# Patient Record
Sex: Female | Born: 1990 | Race: Black or African American | Hispanic: No | Marital: Single | Smoking: Current some day smoker
Health system: Southern US, Community
[De-identification: ages and names within clinical notes are randomized; demographics above are authoritative.]

## PROBLEM LIST (undated history)

## (undated) DIAGNOSIS — R51 Headache: Secondary | ICD-10-CM

## (undated) DIAGNOSIS — R519 Headache, unspecified: Secondary | ICD-10-CM

## (undated) HISTORY — PX: ROTATOR CUFF REPAIR: SHX139

## (undated) HISTORY — PX: BREAST SURGERY: SHX581

## (undated) HISTORY — PX: BUNIONECTOMY: SHX129

---

## 2011-08-25 ENCOUNTER — Emergency Department (HOSPITAL_BASED_OUTPATIENT_CLINIC_OR_DEPARTMENT_OTHER)
Admission: EM | Admit: 2011-08-25 | Discharge: 2011-08-25 | Disposition: A | Discharge status: Home / Self Care | Payer: Medicaid Other | Attending: Emergency Medicine | Admitting: Emergency Medicine

## 2011-08-25 ENCOUNTER — Encounter: Payer: Self-pay | Admitting: *Deleted

## 2011-08-25 ENCOUNTER — Emergency Department (INDEPENDENT_AMBULATORY_CARE_PROVIDER_SITE_OTHER): Payer: Medicaid Other

## 2011-08-25 DIAGNOSIS — H9209 Otalgia, unspecified ear: Secondary | ICD-10-CM | POA: Insufficient documentation

## 2011-08-25 DIAGNOSIS — J029 Acute pharyngitis, unspecified: Secondary | ICD-10-CM

## 2011-08-25 DIAGNOSIS — B349 Viral infection, unspecified: Secondary | ICD-10-CM

## 2011-08-25 DIAGNOSIS — B9789 Other viral agents as the cause of diseases classified elsewhere: Secondary | ICD-10-CM | POA: Insufficient documentation

## 2011-08-25 DIAGNOSIS — R05 Cough: Secondary | ICD-10-CM

## 2011-08-25 MED ORDER — GUAIFENESIN-CODEINE 100-10 MG/5ML PO SYRP: 5.0000 mL | ORAL_SOLUTION | Freq: Three times a day (TID) | ORAL | Status: AC | PRN

## 2011-08-25 MED ORDER — IBUPROFEN 800 MG PO TABS: 800.0000 mg | ORAL_TABLET | Freq: Three times a day (TID) | ORAL | Status: AC

## 2011-08-25 NOTE — ED Provider Notes (Signed)
History     CSN: [id number] Arrival date & time: 08/25/2011  6:14 PM   First MD Initiated Contact with Patient 08/25/11 1846      Chief Complaint  Patient presents with  . Sore Throat    (Consider location/radiation/quality/duration/timing/severity/associated sxs/prior treatment) Patient is a 20 y.o. female presenting with cough. The history is provided by the patient. No language interpreter was used.  Cough This is a new problem. The current episode started 2 days ago. The problem occurs constantly. The problem has been gradually worsening. The cough is non-productive. There has been no fever. Associated symptoms include ear pain, headaches, rhinorrhea and sore throat. She has tried nothing for the symptoms. She is a smoker. Her past medical history does not include bronchitis, pneumonia or asthma.    History reviewed. No pertinent past medical history.  History reviewed. No pertinent past surgical history.  History reviewed. No pertinent family history.  History  Substance Use Topics  . Smoking status: Current Everyday Smoker  . Smokeless tobacco: Not on file  . Alcohol Use: No    OB History    Grav Para Term Preterm Abortions TAB SAB Ect Mult Living                  Review of Systems  HENT: Positive for ear pain, sore throat and rhinorrhea.   Respiratory: Positive for cough.   Neurological: Positive for headaches.  All other systems reviewed and are negative.    Allergies  Review of patient's allergies indicates no known allergies.  Home Medications   Current Outpatient Rx  Name Route Sig Dispense Refill  . ETONOGESTREL 68 MG [place] IMPL Subcutaneous Inject 1 each into the skin once.        BP 111/61  Pulse 76  Temp(Src) 98.6 F (37 C) (Oral)  Resp 18  Ht 5\' 5"  (1.651 m)  Wt 170 lb (77.111 kg)  BMI 28.29 kg/m2  SpO2 100%  LMP 08/18/2011  Physical Exam  Nursing note and vitals reviewed. Constitutional: She is oriented to person, place, and time. She  appears well-developed and well-nourished.  HENT:  Head: Normocephalic and atraumatic.  Right Ear: External ear normal.  Left Ear: External ear normal.  Nose: Nose normal.  Mouth/Throat: Oropharynx is clear and moist.  Eyes: Conjunctivae and EOM are normal. Pupils are equal, round, and reactive to light.  Neck: Normal range of motion. Neck supple.  Cardiovascular: Normal rate and normal heart sounds.   Pulmonary/Chest: Effort normal.  Abdominal: Soft.  Musculoskeletal: Normal range of motion.  Neurological: She is alert and oriented to person, place, and time.  Skin: Skin is warm.  Psychiatric: She has a normal mood and affect.    ED Course  Procedures (including critical care time)  Labs Reviewed - No data to display Dg Chest 2 View  08/25/2011  *RADIOLOGY REPORT*  Clinical Data: Cough and sore throat.  CHEST - 2 VIEW  Comparison: None.  Findings: The lungs are clear without focal consolidation, edema, effusion or pneumothorax.  Cardiopericardial silhouette is within normal limits for size.  Imaged bony structures of the thorax are intact.  IMPRESSION: Normal chest x-ray.  Original Report Authenticated By: ERIC A. MANSELL, M.D.     No diagnosis found.    MDM  No pneumonia  I will treat with ibuprofen and cheratuss        Langston Masker, [place] 08/25/11 2055

## 2011-08-25 NOTE — ED Notes (Signed)
Pt states she has had sore throat, chills, body aches and fever since Friday.

## 2011-08-25 NOTE — ED Provider Notes (Signed)
Evaluation and management procedures were performed by the PA/NP under my supervision/collaboration.    Demitria Hay D Skylur Fuston, MD 08/25/11 2335 

## 2013-08-09 ENCOUNTER — Encounter (HOSPITAL_BASED_OUTPATIENT_CLINIC_OR_DEPARTMENT_OTHER): Payer: Self-pay | Admitting: Emergency Medicine

## 2013-08-09 ENCOUNTER — Emergency Department (HOSPITAL_BASED_OUTPATIENT_CLINIC_OR_DEPARTMENT_OTHER): Payer: Medicaid Other

## 2013-08-09 ENCOUNTER — Emergency Department (HOSPITAL_BASED_OUTPATIENT_CLINIC_OR_DEPARTMENT_OTHER)
Admission: EM | Admit: 2013-08-09 | Discharge: 2013-08-09 | Disposition: A | Discharge status: Home / Self Care | Payer: Medicaid Other | Attending: Emergency Medicine | Admitting: Emergency Medicine

## 2013-08-09 DIAGNOSIS — R109 Unspecified abdominal pain: Secondary | ICD-10-CM | POA: Insufficient documentation

## 2013-08-09 DIAGNOSIS — K6289 Other specified diseases of anus and rectum: Secondary | ICD-10-CM

## 2013-08-09 DIAGNOSIS — Z3202 Encounter for pregnancy test, result negative: Secondary | ICD-10-CM | POA: Insufficient documentation

## 2013-08-09 DIAGNOSIS — M545 Low back pain, unspecified: Secondary | ICD-10-CM | POA: Insufficient documentation

## 2013-08-09 DIAGNOSIS — N83209 Unspecified ovarian cyst, unspecified side: Secondary | ICD-10-CM | POA: Insufficient documentation

## 2013-08-09 DIAGNOSIS — N83202 Unspecified ovarian cyst, left side: Secondary | ICD-10-CM

## 2013-08-09 DIAGNOSIS — K644 Residual hemorrhoidal skin tags: Secondary | ICD-10-CM | POA: Insufficient documentation

## 2013-08-09 DIAGNOSIS — F17200 Nicotine dependence, unspecified, uncomplicated: Secondary | ICD-10-CM | POA: Insufficient documentation

## 2013-08-09 LAB — URINE MICROSCOPIC-ADD ON

## 2013-08-09 LAB — COMPREHENSIVE METABOLIC PANEL
ALT: 16 U/L (ref 0–35)
AST: 19 U/L (ref 0–37)
Albumin: 3.5 g/dL (ref 3.5–5.2)
Alkaline Phosphatase: 56 U/L (ref 39–117)
Calcium: 9 mg/dL (ref 8.4–10.5)
GFR calc Af Amer: >90 mL/min (ref >90)
Glucose, Bld: 95 mg/dL (ref 70–99)
Potassium: 3.7 mEq/L (ref 3.5–5.1)
Sodium: 138 mEq/L (ref 135–145)
Total Protein: 6.9 g/dL (ref 6.0–8.3)

## 2013-08-09 LAB — CBC WITH DIFFERENTIAL/PLATELET
Basophils Absolute: 0 10*3/uL (ref 0.0–0.1)
Eosinophils Absolute: 0.3 10*3/uL (ref 0.0–0.7)
Eosinophils Relative: 2 % (ref 0–5)
Lymphs Abs: 3.7 10*3/uL (ref 0.7–4.0)
MCH: 31.3 pg (ref 26.0–34.0)
MCV: 93.6 fL (ref 78.0–100.0)
Neutrophils Relative %: 63 % (ref 43–77)
Platelets: 337 10*3/uL (ref 150–400)
RBC: 3.77 MIL/uL — ABNORMAL LOW (ref 3.87–5.11)
RDW: 12.1 % (ref 11.5–15.5)
WBC: 13.6 10*3/uL — ABNORMAL HIGH (ref 4.0–10.5)

## 2013-08-09 LAB — URINALYSIS, ROUTINE W REFLEX MICROSCOPIC
Bilirubin Urine: NEGATIVE
Glucose, UA: NEGATIVE mg/dL
Hgb urine dipstick: NEGATIVE
Ketones, ur: NEGATIVE mg/dL
Nitrite: NEGATIVE
Specific Gravity, Urine: 1.024 (ref 1.005–1.030)
pH: 8 (ref 5.0–8.0)

## 2013-08-09 LAB — PREGNANCY, URINE: Preg Test, Ur: NEGATIVE

## 2013-08-09 MED ORDER — IOHEXOL 300 MG/ML  SOLN
100.0000 mL | Freq: Once | INTRAMUSCULAR | Status: AC | PRN
  Administered 2013-08-09: 100 mL via INTRAVENOUS

## 2013-08-09 MED ORDER — IOHEXOL 300 MG/ML  SOLN
50.0000 mL | Freq: Once | INTRAMUSCULAR | Status: AC | PRN
  Administered 2013-08-09: 50 mL via ORAL

## 2013-08-09 MED ORDER — HYDROCODONE-ACETAMINOPHEN 5-325 MG PO TABS
1.0000 | ORAL_TABLET | Freq: Once | ORAL | Status: AC
  Administered 2013-08-09: 1 via ORAL
  Filled 2013-08-09: qty 1

## 2013-08-09 MED ORDER — HYDROCODONE-ACETAMINOPHEN 5-325 MG PO TABS: 1.0000 | ORAL_TABLET | ORAL | Status: DC | PRN

## 2013-08-09 NOTE — ED Provider Notes (Signed)
CSN: [id number]     Arrival date & time 08/09/13  1451 History  This chart was scribed for Candyce Churn, MD by Leone Payor, ED Scribe. This patient was seen in room MH12/MH12 and the patient's care was started 3:08 PM.    Chief Complaint  Patient presents with  . Rectal Pain    The history is provided by the patient. No language interpreter was used.    HPI Comments: Raquell Cimmino is a 22 y.o. female who presents to the Emergency Department complaining of constant, worsened rectal pain that began today. Pt describes this pain as sharp and states it is worsened by sitting down and having a BM. Pt states her last BM was this morning and was very painful. She has associated lower back and lower abdominal pain.  She reports having similar symptoms in the past but this episode is more severe and has lasted much longer. She denies history of hemorrhoids. She denies constipation, hematochezia, vaginal bleeding, vaginal discharge, dysuria, fever.    History reviewed. No pertinent past medical history. Past Surgical History  Procedure Laterality Date  . Rotator cuff repair      R shoulder   No family history on file. History  Substance Use Topics  . Smoking status: Current Every Day Smoker  . Smokeless tobacco: Not on file  . Alcohol Use: No   OB History   Grav Para Term Preterm Abortions TAB SAB Ect Mult Living                 Review of Systems  Constitutional: Negative for fever.  Gastrointestinal: Positive for abdominal pain and rectal pain. Negative for constipation, blood in stool and anal bleeding.  Genitourinary: Negative for dysuria, vaginal bleeding and vaginal discharge.  Musculoskeletal: Positive for back pain.  All other systems reviewed and are negative.    Allergies  Review of patient's allergies indicates no known allergies.  Home Medications   Current Outpatient Rx  Name  Route  Sig  Dispense  Refill  . etonogestrel (IMPLANON) 68 MG IMPL implant  Subcutaneous   Inject 1 each into the skin once.            BP 109/57  Pulse 82  Temp(Src) 98.6 F (37 C) (Oral)  Resp 18  Ht 5\' 5"  (1.651 m)  Wt 175 lb (79.379 kg)  BMI 29.12 kg/m2  SpO2 99%  LMP 07/17/2013 Physical Exam  Nursing note and vitals reviewed. Constitutional: She is oriented to person, place, and time. She appears well-developed and well-nourished. No distress.  HENT:  Head: Normocephalic and atraumatic.  Mouth/Throat: Oropharynx is clear and moist.  Eyes: Conjunctivae are normal. Pupils are equal, round, and reactive to light. No scleral icterus.  Neck: Neck supple.  Cardiovascular: Normal rate, regular rhythm, normal heart sounds and intact distal pulses.   No murmur heard. Pulmonary/Chest: Effort normal and breath sounds normal. No stridor. No respiratory distress. She has no rales.  Abdominal: Soft. Bowel sounds are normal. She exhibits no distension. There is no tenderness.  Genitourinary: Rectal exam shows external hemorrhoid (2 small, soft, non tender external hemorrhoids ) and tenderness ( Tenderness to palpation of the proximal rectum). Rectal exam shows no internal hemorrhoid, no fissure and no mass.  Musculoskeletal: Normal range of motion.  Neurological: She is alert and oriented to person, place, and time.  Skin: Skin is warm and dry. No rash noted.  Psychiatric: She has a normal mood and affect. Her behavior is normal.  ED Course  Procedures   DIAGNOSTIC STUDIES: Oxygen Saturation is 99% on RA, normal by my interpretation.    COORDINATION OF CARE: 3:26 PM Will order lab work and give Vicodin for pain. Discussed treatment plan with pt at bedside and pt agreed to plan.  Medications  HYDROcodone-acetaminophen (NORCO/VICODIN) 5-325 MG per tablet 1 tablet (1 tablet Oral Given 08/09/13 1552)  iohexol (OMNIPAQUE) 300 MG/ML solution 50 mL (50 mLs Oral Contrast Given 08/09/13 1710)  iohexol (OMNIPAQUE) 300 MG/ML solution 100 mL (100 mLs Intravenous  Contrast Given 08/09/13 1937)    Labs Review Labs Reviewed  CBC WITH DIFFERENTIAL - Abnormal; Notable for the following:    WBC 13.6 (*)    RBC 3.77 (*)    Hemoglobin 11.8 (*)    HCT 35.3 (*)    Neutro Abs 8.5 (*)    All other components within normal limits  URINALYSIS, ROUTINE W REFLEX MICROSCOPIC - Abnormal; Notable for the following:    APPearance TURBID (*)    All other components within normal limits  URINE MICROSCOPIC-ADD ON - Abnormal; Notable for the following:    Bacteria, UA FEW (*)    All other components within normal limits  COMPREHENSIVE METABOLIC PANEL  OCCULT BLOOD X 1 CARD TO LAB, STOOL  PREGNANCY, URINE   Imaging Review Ct Abdomen Pelvis W Contrast  08/09/2013   CLINICAL DATA:  Rectal pain.  EXAM: CT ABDOMEN AND PELVIS WITH CONTRAST  TECHNIQUE: Multidetector CT imaging of the abdomen and pelvis was performed using the standard protocol following bolus administration of intravenous contrast.  CONTRAST:  OMNIPAQUE IOHEXOL 300 MG/ML  SOLN  COMPARISON:  None.  FINDINGS: The liver, gallbladder, pancreas, spleen, adrenal glands, and kidneys are normal in appearance. No evidence of hydronephrosis. Uterus is unremarkable. A simple appearing cyst is seen in the left adnexa measuring 2.8 x 5.2 cm. No other pelvic masses or lymphadenopathy identified.  No evidence of inflammatory process or abnormal fluid collections. There is no evidence of bowel wall thickening, dilatation, or hernia. Normal appendix visualized.  IMPRESSION: 5.2 cm benign appearing left adnexal cyst, most likely representing a functional ovarian cyst. Followup by pelvic ultrasound recommended in 6-12 weeks. This recommendation follows ACR consensus guidelines: White Paper of the ACR Incidental Findings Committee II on Adnexal Findings. J Am Coll Radiol [phone].   Electronically Signed   By: Myles Rosenthal M.D.   On: 08/09/2013 19:59    EKG Interpretation   None       MDM   1. Rectal pain   2.  Left ovarian cyst    22 yo female with "rectal" pain and pain with bowel movements.  On exam, two soft nontender external hemorrhoids, but no internal masses. Does have tenderness on DRE.  Well appearing, no distress, nontoxic, but uncomfortable.  No hx of IBD.  No blood in stool.    Due to deeper nature of pain, CT imaging obtained.  Did not show rectal abscess.  She remained well appearing. Pain improved.  It did show ovarian cyst, which was communicated to her, as well as need for followup.  Repeat abdominal exam benign.  Presentation not c/w ovarian torsion or TOA.    I personally performed the services described in this documentation, which was scribed in my presence. The recorded information has been reviewed and is accurate.     Candyce Churn, MD 08/09/13 [phone]

## 2013-08-09 NOTE — ED Notes (Signed)
Pt. Reports rectal pain that  Shoots down per Pt. And now she is having back pain.  No rectal bleeding and last BM was this morning.

## 2013-10-20 ENCOUNTER — Encounter (HOSPITAL_BASED_OUTPATIENT_CLINIC_OR_DEPARTMENT_OTHER): Payer: Self-pay | Admitting: Emergency Medicine

## 2013-10-20 ENCOUNTER — Emergency Department (HOSPITAL_BASED_OUTPATIENT_CLINIC_OR_DEPARTMENT_OTHER)
Admission: EM | Admit: 2013-10-20 | Discharge: 2013-10-20 | Disposition: A | Discharge status: Home / Self Care | Payer: Medicaid Other | Attending: Emergency Medicine | Admitting: Emergency Medicine

## 2013-10-20 DIAGNOSIS — F17200 Nicotine dependence, unspecified, uncomplicated: Secondary | ICD-10-CM | POA: Insufficient documentation

## 2013-10-20 DIAGNOSIS — J069 Acute upper respiratory infection, unspecified: Secondary | ICD-10-CM | POA: Insufficient documentation

## 2013-10-20 NOTE — Discharge Instructions (Signed)

## 2013-10-20 NOTE — ED Notes (Signed)
C/o "body pain, sore throat, head cold" started yesterday

## 2013-10-20 NOTE — ED Provider Notes (Signed)
CSN: [id number]     Arrival date & time 10/20/13  1651 History   First MD Initiated Contact with Patient 10/20/13 1715     Chief Complaint  Patient presents with  . URI   (Consider location/radiation/quality/duration/timing/severity/associated sxs/prior Treatment) Patient is a 23 y.o. female presenting with URI.  URI  Pt with no signficant PMH reports <24hrs of URI symptoms of sore throat, runny nose, dry cough, myalgias, general malaise and subjective fever. Improved with OTC remedies. Needs work note.   History reviewed. No pertinent past medical history. Past Surgical History  Procedure Laterality Date  . Rotator cuff repair      R shoulder   No family history on file. History  Substance Use Topics  . Smoking status: Current Every Day Smoker  . Smokeless tobacco: Not on file  . Alcohol Use: No   OB History   Grav Para Term Preterm Abortions TAB SAB Ect Mult Living                 Review of Systems All other systems reviewed and are negative except as noted in HPI.   Allergies  Review of patient's allergies indicates no known allergies.  Home Medications   Current Outpatient Rx  Name  Route  Sig  Dispense  Refill  . etonogestrel (IMPLANON) 68 MG IMPL implant   Subcutaneous   Inject 1 each into the skin once.           Marland Kitchen. HYDROcodone-acetaminophen (NORCO/VICODIN) 5-325 MG per tablet   Oral   Take 1 tablet by mouth every 4 (four) hours as needed.   12 tablet   0    BP 105/56  Pulse 72  Temp(Src) 99 F (37.2 C) (Oral)  Resp 20  Ht 5\' 5"  (1.651 m)  Wt 170 lb (77.111 kg)  BMI 28.29 kg/m2  SpO2 99%  LMP 10/18/2013 Physical Exam  Nursing note and vitals reviewed. Constitutional: She is oriented to person, place, and time. She appears well-developed and well-nourished.  HENT:  Head: Normocephalic and atraumatic.  Eyes: EOM are normal. Pupils are equal, round, and reactive to light.  Neck: Normal range of motion. Neck supple.  Cardiovascular: Normal rate,  normal heart sounds and intact distal pulses.   Pulmonary/Chest: Effort normal and breath sounds normal.  Abdominal: Bowel sounds are normal. She exhibits no distension. There is no tenderness.  Musculoskeletal: Normal range of motion. She exhibits no edema and no tenderness.  Neurological: She is alert and oriented to person, place, and time. She has normal strength. No cranial nerve deficit or sensory deficit.  Skin: Skin is warm and dry. No rash noted.  Psychiatric: She has a normal mood and affect.    ED Course  Procedures (including critical care time) Labs Review Labs Reviewed - No data to display Imaging Review No results found.  EKG Interpretation   None       MDM   1. URI, acute        Charles B. Bernette MayersSheldon, MD 10/20/13 1736

## 2013-11-03 ENCOUNTER — Emergency Department (HOSPITAL_BASED_OUTPATIENT_CLINIC_OR_DEPARTMENT_OTHER)
Admission: EM | Admit: 2013-11-03 | Discharge: 2013-11-03 | Disposition: A | Discharge status: Home / Self Care | Payer: Medicaid Other | Attending: Emergency Medicine | Admitting: Emergency Medicine

## 2013-11-03 ENCOUNTER — Emergency Department (HOSPITAL_BASED_OUTPATIENT_CLINIC_OR_DEPARTMENT_OTHER): Payer: Medicaid Other

## 2013-11-03 ENCOUNTER — Encounter (HOSPITAL_BASED_OUTPATIENT_CLINIC_OR_DEPARTMENT_OTHER): Payer: Self-pay | Admitting: Emergency Medicine

## 2013-11-03 DIAGNOSIS — R110 Nausea: Secondary | ICD-10-CM | POA: Insufficient documentation

## 2013-11-03 DIAGNOSIS — O039 Complete or unspecified spontaneous abortion without complication: Secondary | ICD-10-CM | POA: Insufficient documentation

## 2013-11-03 DIAGNOSIS — O9989 Other specified diseases and conditions complicating pregnancy, childbirth and the puerperium: Secondary | ICD-10-CM | POA: Insufficient documentation

## 2013-11-03 DIAGNOSIS — O99330 Smoking (tobacco) complicating pregnancy, unspecified trimester: Secondary | ICD-10-CM | POA: Insufficient documentation

## 2013-11-03 DIAGNOSIS — R42 Dizziness and giddiness: Secondary | ICD-10-CM | POA: Insufficient documentation

## 2013-11-03 LAB — CBC WITH DIFFERENTIAL/PLATELET
Basophils Absolute: 0 10*3/uL (ref 0.0–0.1)
Basophils Relative: 0 % (ref 0–1)
EOS PCT: 1 % (ref 0–5)
Eosinophils Absolute: 0.1 10*3/uL (ref 0.0–0.7)
HEMATOCRIT: 35.6 % — AB (ref 36.0–46.0)
Hemoglobin: 12.1 g/dL (ref 12.0–15.0)
LYMPHS ABS: 2.6 10*3/uL (ref 0.7–4.0)
LYMPHS PCT: 22 % (ref 12–46)
MCH: 31.7 pg (ref 26.0–34.0)
MCHC: 34 g/dL (ref 30.0–36.0)
MCV: 93.2 fL (ref 78.0–100.0)
Monocytes Absolute: 0.9 10*3/uL (ref 0.1–1.0)
Monocytes Relative: 7 % (ref 3–12)
Neutro Abs: 8.4 10*3/uL — ABNORMAL HIGH (ref 1.7–7.7)
Neutrophils Relative %: 70 % (ref 43–77)
PLATELETS: 336 10*3/uL (ref 150–400)
RBC: 3.82 MIL/uL — AB (ref 3.87–5.11)
RDW: 11.7 % (ref 11.5–15.5)
WBC: 11.9 10*3/uL — AB (ref 4.0–10.5)

## 2013-11-03 LAB — HCG, QUANTITATIVE, PREGNANCY: HCG, BETA CHAIN, QUANT, S: 337 m[IU]/mL — AB (ref <5)

## 2013-11-03 MED ORDER — MORPHINE SULFATE 4 MG/ML IJ SOLN
4.0000 mg | Freq: Once | INTRAMUSCULAR | Status: AC
  Administered 2013-11-03: 4 mg via INTRAVENOUS
  Filled 2013-11-03: qty 1

## 2013-11-03 MED ORDER — ACETAMINOPHEN 325 MG PO TABS
650.0000 mg | ORAL_TABLET | Freq: Once | ORAL | Status: AC
  Administered 2013-11-03: 650 mg via ORAL
  Filled 2013-11-03: qty 2

## 2013-11-03 NOTE — Discharge Instructions (Signed)
Miscarriage A miscarriage is the sudden loss of an unborn baby (fetus) before the 20th week of pregnancy. Most miscarriages happen in the first 3 months of pregnancy. Sometimes, it happens before a woman even knows she is pregnant. A miscarriage is also called a "spontaneous miscarriage" or "early pregnancy loss." Having a miscarriage can be an emotional experience. Talk with your caregiver about any questions you may have about miscarrying, the grieving process, and your future pregnancy plans.  Your beta HCG was 337 which has decreased and your US shows no intrauterine pregnancy.   CAUSES   Problems with the fetal chromosomes that make it impossible for the baby to develop normally. Problems with the baby's genes or chromosomes are most often the result of errors that occur, by chance, as the embryo divides and grows. The problems are not inherited from the parents.  Infection of the cervix or uterus.   Hormone problems.   Problems with the cervix, such as having an incompetent cervix. This is when the tissue in the cervix is not strong enough to hold the pregnancy.   Problems with the uterus, such as an abnormally shaped uterus, uterine fibroids, or congenital abnormalities.   Certain medical conditions.   Smoking, drinking alcohol, or taking illegal drugs.   Trauma.  Often, the cause of a miscarriage is unknown.  SYMPTOMS   Vaginal bleeding or spotting, with or without cramps or pain.  Pain or cramping in the abdomen or lower back.  Passing fluid, tissue, or blood clots from the vagina. DIAGNOSIS  Your caregiver will perform a physical exam. You may also have an ultrasound to confirm the miscarriage. Blood or urine tests may also be ordered. TREATMENT   Sometimes, treatment is not necessary if you naturally pass all the fetal tissue that was in the uterus. If some of the fetus or placenta remains in the body (incomplete miscarriage), tissue left behind may become infected  and must be removed. Usually, a dilation and curettage (D and C) procedure is performed. During a D and C procedure, the cervix is widened (dilated) and any remaining fetal or placental tissue is gently removed from the uterus.  Antibiotic medicines are prescribed if there is an infection. Other medicines may be given to reduce the size of the uterus (contract) if there is a lot of bleeding.  If you have Rh negative blood and your baby was Rh positive, you will need a Rh immunoglobulin shot. This shot will protect any future baby from having Rh blood problems in future pregnancies. HOME CARE INSTRUCTIONS   Your caregiver may order bed rest or may allow you to continue light activity. Resume activity as directed by your caregiver.  Have someone help with home and family responsibilities during this time.   Keep track of the number of sanitary pads you use each day and how soaked (saturated) they are. Write down this information.   Do not use tampons. Do not douche or have sexual intercourse until approved by your caregiver.   Only take over-the-counter or prescription medicines for pain or discomfort as directed by your caregiver.   Do not take aspirin. Aspirin can cause bleeding.   Keep all follow-up appointments with your caregiver.   If you or your partner have problems with grieving, talk to your caregiver or seek counseling to help cope with the pregnancy loss. Allow enough time to grieve before trying to get pregnant again.  SEEK IMMEDIATE MEDICAL CARE IF:   You have severe cramps or  pain in your back or abdomen.  You have a fever.  You pass large blood clots (walnut-sized or larger) ortissue from your vagina. Save any tissue for your caregiver to inspect.   Your bleeding increases.   You have a thick, bad-smelling vaginal discharge.  You become lightheaded, weak, or you faint.   You have chills.  MAKE SURE YOU:  Understand these instructions.  Will watch  your condition.  Will get help right away if you are not doing well or get worse. Document Released: 02/26/2001 Document Revised: 12/28/2012 Document Reviewed: 10/22/2011 [facility] Patient Information 2014 [employer] [place].

## 2013-11-03 NOTE — ED Notes (Addendum)
Pt is 3 weeks preg, pt seen by [facility] MD several times for quant and vag bleeding today increased bleeding with lower abd cramping and back pain , pt reports " hormones are not rising like they should "

## 2013-11-03 NOTE — ED Notes (Signed)
[facility]

## 2013-11-03 NOTE — ED Notes (Signed)
Patient transported to Ultrasound 

## 2013-11-03 NOTE — ED Notes (Signed)
MD at bedside. 

## 2013-11-03 NOTE — ED Provider Notes (Signed)
CSN: [id number]     Arrival date & time 11/03/13  1343 History   First MD Initiated Contact with Patient 11/03/13 1541     Chief Complaint  Patient presents with  . Vaginal Bleeding     (Consider location/radiation/quality/duration/timing/severity/associated sxs/prior Treatment) HPI  This is a 23 year old G2 P1 female approximately 3-[redacted] weeks pregnant who presents with vaginal bleeding. Patient has been followed by her obstetrician Dr. Arther AbbottHenry Dorn for vaginal bleeding during pregnancy.  She is getting beta hCGs every 2 days. She's had one ultrasound that did not show an intrauterine pregnancy. Patient reports onset of heavy vaginal bleeding today. She reports dark red blood dropping into the toilet. She reports crampy lower abdominal pain that is mostly left-sided.  Patient also reports dizziness. Currently her pain is 5/10. She states it feels "uncomfortable."  I reviewed the patient's laboratory work from her OB office. Patient had a beta hCG on February 11 that was 553. She subsequently had another beta hCG on February 13 which was 666.  History reviewed. No pertinent past medical history. Past Surgical History  Procedure Laterality Date  . Rotator cuff repair      R shoulder   History reviewed. No pertinent family history. History  Substance Use Topics  . Smoking status: Current Every Day Smoker  . Smokeless tobacco: Not on file  . Alcohol Use: No   OB History   Grav Para Term Preterm Abortions TAB SAB Ect Mult Living                 Review of Systems  Constitutional: Negative for fever.  Respiratory: Negative for chest tightness and shortness of breath.   Cardiovascular: Negative for chest pain.  Gastrointestinal: Positive for nausea and abdominal pain. Negative for vomiting and constipation.  Genitourinary: Positive for vaginal bleeding. Negative for dysuria.  Musculoskeletal: Negative for back pain.  Skin: Negative for wound.  Neurological: Positive for dizziness.  Negative for syncope and headaches.  All other systems reviewed and are negative.      Allergies  Review of patient's allergies indicates no known allergies.  Home Medications   Current Outpatient Rx  Name  Route  Sig  Dispense  Refill  . etonogestrel (IMPLANON) 68 MG IMPL implant   Subcutaneous   Inject 1 each into the skin once.           Marland Kitchen. HYDROcodone-acetaminophen (NORCO/VICODIN) 5-325 MG per tablet   Oral   Take 1 tablet by mouth every 4 (four) hours as needed.   12 tablet   0    BP 100/53  Pulse 68  Resp 16  SpO2 100%  LMP 10/18/2013 Physical Exam  Nursing note and vitals reviewed. Constitutional: She is oriented to person, place, and time. She appears well-developed and well-nourished. No distress.  HENT:  Head: Normocephalic and atraumatic.  Mouth/Throat: Oropharynx is clear and moist.  Eyes: Pupils are equal, round, and reactive to light.  Neck: Neck supple.  Cardiovascular: Normal rate, regular rhythm and normal heart sounds.   Pulmonary/Chest: Effort normal and breath sounds normal. No respiratory distress. She has no wheezes.  Abdominal: Soft. Bowel sounds are normal. There is tenderness. There is no rebound and no guarding.  Genitourinary: Vagina normal.  Small amount of dark blood in the vaginal vault, no active bleeding, no clots noted, cervical os is closed, no adnexal masses or tenderness noted  Neurological: She is alert and oriented to person, place, and time.  Skin: Skin is warm and dry.  Psychiatric:  She has a normal mood and affect.    ED Course  Procedures (including critical care time) Labs Review Labs Reviewed  CBC WITH DIFFERENTIAL - Abnormal; Notable for the following:    WBC 11.9 (*)    RBC 3.82 (*)    HCT 35.6 (*)    Neutro Abs 8.4 (*)    All other components within normal limits  HCG, QUANTITATIVE, PREGNANCY - Abnormal; Notable for the following:    hCG, Beta Chain, Quant, S 337 (*)    All other components within normal  limits   Imaging Review US Ob Comp Less 14 Wks  11/03/2013   CLINICAL DATA:  Pregnant patient with vaginal bleeding. Quantitative HCG 337. Last menstrual period 10/18/2013.  EXAM: OBSTETRIC <14 WK [place] AND TRANSVAGINAL OB US  TECHNIQUE: Both transabdominal and transvaginal ultrasound examinations were performed for complete evaluation of the gestation as well as the maternal uterus, adnexal regions, and pelvic cul-de-sac. Transvaginal technique was performed to assess early pregnancy.  COMPARISON:  None.  FINDINGS: Intrauterine gestational sac: Not visualized.  Yolk sac:  Not visualized.  Embryo:  Not visualized.  Maternal uterus/adnexae: Unremarkable.  IMPRESSION: No evidence of ectopic or intrauterine pregnancy is identified. Findings may be due to early IUP or AB in progress as the patient has vaginal bleeding.   Electronically Signed   By: Drusilla Kanner M.D.   On: 11/03/2013 17:42   US Ob Transvaginal  11/03/2013   CLINICAL DATA:  Pregnant patient with vaginal bleeding. Quantitative HCG 337. Last menstrual period 10/18/2013.  EXAM: OBSTETRIC <14 WK [place] AND TRANSVAGINAL OB US  TECHNIQUE: Both transabdominal and transvaginal ultrasound examinations were performed for complete evaluation of the gestation as well as the maternal uterus, adnexal regions, and pelvic cul-de-sac. Transvaginal technique was performed to assess early pregnancy.  COMPARISON:  None.  FINDINGS: Intrauterine gestational sac: Not visualized.  Yolk sac:  Not visualized.  Embryo:  Not visualized.  Maternal uterus/adnexae: Unremarkable.  IMPRESSION: No evidence of ectopic or intrauterine pregnancy is identified. Findings may be due to early IUP or AB in progress as the patient has vaginal bleeding.   Electronically Signed   By: Drusilla Kanner M.D.   On: 11/03/2013 17:42    EKG Interpretation   None       MDM   Final diagnoses:  Miscarriage   Patient presents with vaginal bleeding during early pregnancy. She is  nontoxic-appearing on exam. She is scant blood in the vaginal vault. Repeat beta hCG here is 337 which is down from 666 based on records. Ultrasound shows no evidence of intrauterine pregnancy or ectopic. Results suggestive of miscarriage in process.  I discussed the results with the patient. She is to followup with her OB. She was given bleeding precautions.  After history, exam, and medical workup I feel the patient has been appropriately medically screened and is safe for discharge home. Pertinent diagnoses were discussed with the patient. Patient was given return precautions.     Shon Baton, MD 11/03/13 [phone]

## 2013-11-03 NOTE — ED Notes (Signed)
Family at bedside. 

## 2016-05-08 ENCOUNTER — Encounter (HOSPITAL_BASED_OUTPATIENT_CLINIC_OR_DEPARTMENT_OTHER): Payer: Self-pay | Admitting: Emergency Medicine

## 2016-05-08 ENCOUNTER — Emergency Department (HOSPITAL_BASED_OUTPATIENT_CLINIC_OR_DEPARTMENT_OTHER)
Admission: EM | Admit: 2016-05-08 | Discharge: 2016-05-08 | Disposition: A | Discharge status: Home / Self Care | Payer: Medicaid Other | Attending: Emergency Medicine | Admitting: Emergency Medicine

## 2016-05-08 DIAGNOSIS — Z87891 Personal history of nicotine dependence: Secondary | ICD-10-CM | POA: Diagnosis not present

## 2016-05-08 DIAGNOSIS — Z3A24 24 weeks gestation of pregnancy: Secondary | ICD-10-CM | POA: Insufficient documentation

## 2016-05-08 DIAGNOSIS — R51 Headache: Secondary | ICD-10-CM | POA: Diagnosis not present

## 2016-05-08 DIAGNOSIS — O26892 Other specified pregnancy related conditions, second trimester: Secondary | ICD-10-CM | POA: Insufficient documentation

## 2016-05-08 DIAGNOSIS — R519 Headache, unspecified: Secondary | ICD-10-CM

## 2016-05-08 LAB — URINALYSIS, ROUTINE W REFLEX MICROSCOPIC
BILIRUBIN URINE: NEGATIVE
GLUCOSE, UA: NEGATIVE mg/dL
HGB URINE DIPSTICK: NEGATIVE
KETONES UR: NEGATIVE mg/dL
Leukocytes, UA: NEGATIVE
Nitrite: NEGATIVE
PROTEIN: NEGATIVE mg/dL
Specific Gravity, Urine: 1.015 (ref 1.005–1.030)
pH: 6 (ref 5.0–8.0)

## 2016-05-08 MED ORDER — METOCLOPRAMIDE HCL 5 MG/ML IJ SOLN
10.0000 mg | Freq: Once | INTRAMUSCULAR | Status: AC
  Administered 2016-05-08: 10 mg via INTRAVENOUS
  Filled 2016-05-08: qty 2

## 2016-05-08 MED ORDER — SODIUM CHLORIDE 0.9 % IV BOLUS (SEPSIS)
1000.0000 mL | Freq: Once | INTRAVENOUS | Status: AC
  Administered 2016-05-08: 1000 mL via INTRAVENOUS

## 2016-05-08 MED ORDER — METOCLOPRAMIDE HCL 10 MG PO TABS: 10.0000 mg | ORAL_TABLET | Freq: Four times a day (QID) | ORAL | 0 refills | Status: DC | PRN

## 2016-05-08 MED ORDER — DIPHENHYDRAMINE HCL 50 MG/ML IJ SOLN
25.0000 mg | Freq: Once | INTRAMUSCULAR | Status: AC
  Administered 2016-05-08: 25 mg via INTRAVENOUS
  Filled 2016-05-08: qty 1

## 2016-05-08 NOTE — ED Triage Notes (Signed)
Pt having daily headaches x1 month.  Taking Tylenol but it is not helping.  [redacted] weeks pregnant.

## 2016-05-08 NOTE — ED Notes (Signed)
Will allow fluid bolus to complete before d/c home.

## 2016-05-08 NOTE — ED Notes (Signed)
MD at bedside. 

## 2016-05-08 NOTE — ED Provider Notes (Signed)
[facility]   CSN: [id number] Arrival date & time: 05/08/16  0122     History   Chief Complaint Chief Complaint  Patient presents with  . Headache    HPI Crystal Henry is a 25 y.o. female who was about [redacted] weeks pregnant. She has no history of chronic headaches. She's been having nearly daily headaches for the past several weeks. They last for several hours. They're located frontally and may occur on just one side at times. Pain is worse with coughing, sneezing or leaning forward. She's been taking Tylenol without adequate relief. She has had associated photophobia and blurred vision but no nausea or vomiting except for one episode of nausea yesterday.  HPI  History reviewed. No pertinent past medical history.  There are no active problems to display for this patient.   Past Surgical History:  Procedure Laterality Date  . BREAST SURGERY    . CESAREAN SECTION    . ROTATOR CUFF REPAIR     R shoulder    OB History    Gravida Para Term Preterm AB Living   1             SAB TAB Ectopic Multiple Live Births                   Home Medications    Prior to Admission medications   Medication Sig Start Date End Date Taking? Authorizing Provider  etonogestrel (IMPLANON) 68 MG IMPL implant Inject 1 each into the skin once.      Historical Provider, MD  HYDROcodone-acetaminophen (NORCO/VICODIN) 5-325 MG per tablet Take 1 tablet by mouth every 4 (four) hours as needed. 08/09/13   Blake DivineJohn Wofford, MD    Family History No family history on file.  Social History Social History  Substance Use Topics  . Smoking status: Former Games developermoker  . Smokeless tobacco: Never Used  . Alcohol use No     Allergies   Review of patient's allergies indicates no known allergies.   Review of Systems Review of Systems  All other systems reviewed and are negative.   Physical Exam Updated Vital Signs BP 102/65 (BP Location: Left Arm)   Pulse 80   Temp 98.6 F  (37 C) (Oral)   Resp 18   Ht 5\' 5"  (1.651 m)   Wt 180 lb (81.6 kg)   SpO2 100%   BMI 29.95 kg/m   Physical Exam General: Well-developed, well-nourished female in no acute distress; appearance consistent with age of record HENT: normocephalic; atraumatic Eyes: pupils equal, round and reactive to light; extraocular muscles intact Neck: supple Heart: regular rate and rhythm Lungs: clear to auscultation bilaterally Abdomen: soft; Gravid, consistent with dates; nontender; bowel sounds present Extremities: No deformity; full range of motion; pulses normal Neurologic: Awake, alert and oriented; motor function intact in all extremities and symmetric; no facial droop; normal coordination, speech and gait Skin: Warm and dry Psychiatric: Normal mood and affect    ED Treatments / Results   Nursing notes and vitals signs, including pulse oximetry, reviewed.  Summary of this visit's results, reviewed by myself:  Labs:  Results for orders placed or performed during the hospital encounter of 05/08/16 (from the past 24 hour(s))  Urinalysis, Routine w reflex microscopic (not at [facility])     Status: Abnormal   Collection Time: 05/08/16  2:00 AM  Result Value Ref Range   Color, Urine YELLOW YELLOW   APPearance CLOUDY (A) CLEAR   Specific Gravity, Urine 1.015  1.005 - 1.030   pH 6.0 5.0 - 8.0   Glucose, UA NEGATIVE NEGATIVE mg/dL   Hgb urine dipstick NEGATIVE NEGATIVE   Bilirubin Urine NEGATIVE NEGATIVE   Ketones, ur NEGATIVE NEGATIVE mg/dL   Protein, ur NEGATIVE NEGATIVE mg/dL   Nitrite NEGATIVE NEGATIVE   Leukocytes, UA NEGATIVE NEGATIVE    Procedures (including critical care time)   Final Clinical Impressions(s) / ED Diagnoses  3:59 AM Patient's headache significantly improved after IV fluids and medications.  Final diagnoses:  Headache in pregnancy, second trimester       Paula LibraJohn Beadie Matsunaga, MD 05/08/16 [phone]

## 2016-05-08 NOTE — ED Notes (Signed)
Bolus completed. Po fluids given. D/c home per order

## 2016-08-13 ENCOUNTER — Other Ambulatory Visit (HOSPITAL_COMMUNITY): Payer: Self-pay | Admitting: Specialist

## 2016-08-13 DIAGNOSIS — Z3689 Encounter for other specified antenatal screening: Secondary | ICD-10-CM

## 2016-08-16 ENCOUNTER — Ambulatory Visit (HOSPITAL_COMMUNITY)
Admission: RE | Admit: 2016-08-16 | Discharge: 2016-08-16 | Disposition: A | Discharge status: Home / Self Care | Payer: Medicaid Other | Source: Ambulatory Visit | Attending: Specialist | Admitting: Specialist

## 2016-08-16 ENCOUNTER — Encounter (HOSPITAL_COMMUNITY): Payer: Self-pay

## 2016-08-16 ENCOUNTER — Other Ambulatory Visit (HOSPITAL_COMMUNITY): Payer: Self-pay | Admitting: Specialist

## 2016-08-16 DIAGNOSIS — O34219 Maternal care for unspecified type scar from previous cesarean delivery: Secondary | ICD-10-CM | POA: Diagnosis not present

## 2016-08-16 DIAGNOSIS — O359XX0 Maternal care for (suspected) fetal abnormality and damage, unspecified, not applicable or unspecified: Secondary | ICD-10-CM

## 2016-08-16 DIAGNOSIS — Z3A37 37 weeks gestation of pregnancy: Secondary | ICD-10-CM | POA: Insufficient documentation

## 2016-08-16 DIAGNOSIS — Z3689 Encounter for other specified antenatal screening: Secondary | ICD-10-CM

## 2016-08-16 HISTORY — DX: Headache, unspecified: R51.9

## 2016-08-16 HISTORY — DX: Headache: R51

## 2016-08-20 ENCOUNTER — Other Ambulatory Visit (HOSPITAL_COMMUNITY): Payer: Self-pay

## 2017-06-21 ENCOUNTER — Encounter (HOSPITAL_COMMUNITY): Payer: Self-pay

## 2018-08-30 ENCOUNTER — Emergency Department (HOSPITAL_BASED_OUTPATIENT_CLINIC_OR_DEPARTMENT_OTHER)
Admission: EM | Admit: 2018-08-30 | Discharge: 2018-08-30 | Disposition: A | Discharge status: Home / Self Care | Payer: Self-pay | Attending: Emergency Medicine | Admitting: Emergency Medicine

## 2018-08-30 ENCOUNTER — Encounter (HOSPITAL_BASED_OUTPATIENT_CLINIC_OR_DEPARTMENT_OTHER): Payer: Self-pay | Admitting: Emergency Medicine

## 2018-08-30 ENCOUNTER — Emergency Department (HOSPITAL_BASED_OUTPATIENT_CLINIC_OR_DEPARTMENT_OTHER): Payer: Self-pay

## 2018-08-30 ENCOUNTER — Other Ambulatory Visit: Payer: Self-pay

## 2018-08-30 DIAGNOSIS — Z79899 Other long term (current) drug therapy: Secondary | ICD-10-CM | POA: Insufficient documentation

## 2018-08-30 DIAGNOSIS — Y9389 Activity, other specified: Secondary | ICD-10-CM | POA: Insufficient documentation

## 2018-08-30 DIAGNOSIS — Y92000 Kitchen of unspecified non-institutional (private) residence as  the place of occurrence of the external cause: Secondary | ICD-10-CM | POA: Insufficient documentation

## 2018-08-30 DIAGNOSIS — W010XXA Fall on same level from slipping, tripping and stumbling without subsequent striking against object, initial encounter: Secondary | ICD-10-CM | POA: Insufficient documentation

## 2018-08-30 DIAGNOSIS — W19XXXA Unspecified fall, initial encounter: Secondary | ICD-10-CM

## 2018-08-30 DIAGNOSIS — F17210 Nicotine dependence, cigarettes, uncomplicated: Secondary | ICD-10-CM | POA: Insufficient documentation

## 2018-08-30 DIAGNOSIS — M25561 Pain in right knee: Secondary | ICD-10-CM | POA: Insufficient documentation

## 2018-08-30 DIAGNOSIS — Y999 Unspecified external cause status: Secondary | ICD-10-CM | POA: Insufficient documentation

## 2018-08-30 MED ORDER — NAPROXEN 500 MG PO TABS
500.0000 mg | ORAL_TABLET | Freq: Two times a day (BID) | ORAL | 0 refills | Status: DC
Start: 2018-08-30 — End: 2020-07-30

## 2018-08-30 NOTE — ED Provider Notes (Signed)
[facility] EMERGENCY DEPARTMENT Provider Note   CSN: [id number] Arrival date & time: 08/30/18  1052   History   Chief Complaint Chief Complaint  Patient presents with  . Knee Pain    HPI Crystal Henry is a 27 y.o. female with past medical history significant for rotator cuff repair who presents for evaluation knee pain.  Patient states her daughter spilled juice on her hardwood floors yesterday morning and when she walked into the kitchen patient fell and landed on her right knee.  Patient states she felt immediate pain to her right knee.  States she was able to get off the ground and ambulate, however has significant pain to her right knee with walking.  States her pain is "all over my knee."  Patient states she has been elevating and icing the knee which has decreased the swelling.  Patient states it is painful to flex and extend at the right knee.  Rates her pain an 8/10.  Denies radiation of pain.  Describes her pain as an aching sensation.  Has not taken anything p.o. for the pain.  Pain is constant in nature, however worsens when she ambulates.  Denies fever, chills, numbness, tingling in her extremities, redness, bruising, midline back pain, bilateral hip pain, pelvic pain, nausea, vomiting.  Patient denies dislocation of knee.  History obtained by patient. No interpretor was used.  HPI  Past Medical History:  Diagnosis Date  . Headache     There are no active problems to display for this patient.   Past Surgical History:  Procedure Laterality Date  . BREAST SURGERY    . BUNIONECTOMY     Rt foot  . CESAREAN SECTION    . ROTATOR CUFF REPAIR     R shoulder     OB History    Gravida  4   Para  2   Term  2   Preterm      AB  1   Living  2     SAB  1   TAB      Ectopic      Multiple      Live Births               Home Medications    Prior to Admission medications   Medication Sig Start Date End Date Taking? Authorizing  Provider  Butalbital-APAP-Caffeine (FIORICET PO) Take by mouth.    [provider]  FOLIC ACID PO Take by mouth.    [provider]  metoCLOPramide (REGLAN) 10 MG tablet Take 1 tablet (10 mg total) by mouth every 6 (six) hours as needed (headache). Patient not taking: Reported on 08/16/2016 05/08/16   Molpus, Jonny RuizJohn, MD  naproxen (NAPROSYN) 500 MG tablet Take 1 tablet (500 mg total) by mouth 2 (two) times daily. 08/30/18   Sincere Liuzzi A, PA-C  Prenatal Vit-Fe Fumarate-FA (PRENATAL VITAMIN PO) Take by mouth.    [provider]  Promethazine HCl (PHENERGAN PO) Take by mouth.    [provider]    Family History No family history on file.  Social History Social History   Tobacco Use  . Smoking status: Current Every Day Smoker  . Smokeless tobacco: Never Used  Substance Use Topics  . Alcohol use: No  . Drug use: Not Currently    Types: Marijuana     Allergies   Patient has no known allergies.   Review of Systems Review of Systems  Constitutional: Negative.   Respiratory: Negative.  Cardiovascular: Negative.   Gastrointestinal: Negative.   Musculoskeletal: Negative for arthralgias, joint swelling, myalgias, neck pain and neck stiffness.       Right knee pain.  Skin: Negative.   Neurological: Negative for dizziness, weakness, light-headedness, numbness and headaches.  All other systems reviewed and are negative.    Physical Exam Updated Vital Signs BP 107/77 (BP Location: Right Arm)   Pulse 74   Temp 98.1 F (36.7 C) (Oral)   Resp 18   Ht 5\' 5"  (1.651 m)   Wt 77.1 kg   LMP 07/28/2018   SpO2 100%   BMI 28.29 kg/m   Physical Exam Vitals signs and nursing note reviewed.  Constitutional:      General: She is not in acute distress.    Appearance: She is well-developed. She is not ill-appearing, toxic-appearing or diaphoretic.  HENT:     Head: Atraumatic.  Eyes:     Pupils: Pupils are equal, round, and reactive to light.    Neck:     Musculoskeletal: Normal range of motion.  Cardiovascular:     Rate and Rhythm: Normal rate.     Pulses:          Dorsalis pedis pulses are 2+ on the right side and 2+ on the left side.  Pulmonary:     Effort: No respiratory distress.  Abdominal:     General: There is no distension.  Musculoskeletal:        General: Tenderness and signs of injury present. No deformity.     Right knee: She exhibits decreased range of motion and swelling. She exhibits no ecchymosis, no deformity, no laceration, no erythema, normal alignment, normal patellar mobility, no bony tenderness and normal meniscus. Tenderness found. Medial joint line and lateral joint line tenderness noted. No patellar tendon tenderness noted.     Left knee: Normal.     Left lower leg: No edema.     Right foot: Normal range of motion. No deformity.     Left foot: Normal range of motion. No deformity.     Comments: Tenderness to palpation to right lateral and medial joint line. Tenderness over patella. No stepoffs. Unable to perform varus or valgus stress secondary to pain. Negative anterior drawer test. Able to flex and extend at right knee however with pain. Full ROM left knee without difficulty.  Able to straight leg raise with bilateral legs off bed. Lower extremity compartments are soft.  No tenderness palpation to popliteal fossa on right leg.  She does have mild tenderness to proximal tibia and fibula.  No evidence of step-offs or obvious bony deformities.  No tenderness to palpation to midline back.  Full Range of motion lumbar spine without difficulty or pain.  Able to plantarflex and dorsiflex bilateral ankles without difficulty.  Skin:    General: Skin is warm and dry.     Coloration: Skin is not jaundiced or pale.     Findings: No bruising, erythema, lesion or rash.     Comments: Mild edema to superior right knee above the patella. No erythema, ecchymosis or warmth.  Neurological:     Mental Status: She is alert.      Sensory: No sensory deficit.     Motor: No weakness.     Comments: Intact sensation to sharp and dull to bilateral lower extremity. 5/5 strength to bilateral lower extremity.      ED Treatments / Results  Labs (all labs ordered are listed, but only abnormal results are displayed) Labs Reviewed -  No data to display  EKG None  Radiology Dg Tibia/fibula Right  Result Date: 08/30/2018 CLINICAL DATA:  Pain after trauma EXAM: RIGHT TIBIA AND FIBULA - 2 VIEW COMPARISON:  None. FINDINGS: Question small effusion.  No fractures or dislocations. IMPRESSION: Question small effusion.  No fractures noted. Electronically Signed   By: Gerome Sam III M.D   On: 08/30/2018 11:58   Dg Knee Ap/lat W/sunrise Right  Result Date: 08/30/2018 CLINICAL DATA:  Pain after fall. EXAM: RIGHT KNEE 3 VIEWS COMPARISON:  None. FINDINGS: Question small effusion.  No fractures or dislocations. IMPRESSION: Question small effusion.  No fractures or dislocations. Electronically Signed   By: Gerome Sam III M.D   On: 08/30/2018 11:57    Procedures Procedures (including critical care time)  Medications Ordered in ED Medications - No data to display   Initial Impression / Assessment and Plan / ED Course  I have reviewed the triage vital signs and the nursing notes.  Pertinent labs & imaging results that were available during my care of the patient were reviewed by me and considered in my medical decision making (see chart for details).  27 year old female who appears otherwise well presents for evaluation of knee pain.  Patient presents with right knee pain after mechanical fall at home yesterday morning.  Tenderness to medial lateral joint line as well as patella.  Patient denies dislocation of knee.  Mild suprapatellar joint effusion.  No erythema, warmth.  Full range of motion, however has pain.  No obvious joint deformity.  Patient has been able to ambulate, however has pain with ambulation.   Neurovascularly intact.  Will obtain plain film to r/o fracture or dislocation reevaluate.  Does not want anything for pain at this time as she states she has to drive home.  Afebrile, nonseptic, non-ill-appearing.  Plain film negative for fracture or dislocation to right knee as well as tibia/fibula.  There is some questionable mild effusion to right knee.  Placed patient in knee sleeve and provide crutches.  Discussed with patient RICE for symptomatic management.  Lower extremity compartments are soft.  Discussed follow-up with orthopedics for reevaluation if she has continued pain.  Patient has been able to ambulate in department. Low suspicion for neurovascular injury.  Discussed return precautions.  Patient voiced understanding and is agreeable for follow-up.    Final Clinical Impressions(s) / ED Diagnoses   Final diagnoses:  Acute pain of right knee  Fall, initial encounter    ED Discharge Orders         Ordered    naproxen (NAPROSYN) 500 MG tablet  2 times daily     08/30/18 1231           Francine Hannan A, PA-C 08/30/18 1238    Rolan Bucco, MD 08/30/18 1343

## 2018-08-30 NOTE — Discharge Instructions (Signed)
You were evaluated today for knee pain. Your x-rays do not show any evidence of fracture or dislocation.  Is possible you have a sprain, strain or ligament injury to your right knee.  We have placed you in a knee splint and provided you with crutches.  Please continue to ice, elevate and rest your knee.  Please follow-up with your orthopedist would you stay [place] [facility] for reevaluation if you continue to have pain beyond 3 days.  Return to the ED for any new or worsening symptoms.

## 2018-08-30 NOTE — ED Triage Notes (Signed)
PT sts she slipped in juice yesterday and has RT knee pain and swelling; reports decreased ROM

## 2020-04-04 ENCOUNTER — Emergency Department (HOSPITAL_BASED_OUTPATIENT_CLINIC_OR_DEPARTMENT_OTHER)
Admission: EM | Admit: 2020-04-04 | Discharge: 2020-04-04 | Disposition: A | Discharge status: Home / Self Care | Payer: Medicaid Other | Attending: Emergency Medicine | Admitting: Emergency Medicine

## 2020-04-04 ENCOUNTER — Encounter (HOSPITAL_BASED_OUTPATIENT_CLINIC_OR_DEPARTMENT_OTHER): Payer: Self-pay | Admitting: *Deleted

## 2020-04-04 ENCOUNTER — Other Ambulatory Visit: Payer: Self-pay

## 2020-04-04 DIAGNOSIS — F17210 Nicotine dependence, cigarettes, uncomplicated: Secondary | ICD-10-CM | POA: Insufficient documentation

## 2020-04-04 DIAGNOSIS — R21 Rash and other nonspecific skin eruption: Secondary | ICD-10-CM | POA: Diagnosis present

## 2020-04-04 MED ORDER — NYSTATIN 100000 UNIT/GM EX CREA: TOPICAL_CREAM | CUTANEOUS | 1 refills | Status: DC

## 2020-04-04 NOTE — ED Triage Notes (Signed)
Red elevated, pinpoint rashes on her abdomen and underneath her breast.  Pt started to noticed it this past Thursday and stated that it itches.

## 2020-04-04 NOTE — ED Notes (Signed)
ED Provider at bedside. 

## 2020-04-04 NOTE — ED Provider Notes (Signed)
[facility]GENCY [facility]PT [facility] Emergency Department Provider Note MRN:  [id number]  Arrival date & time: 04/04/20     Chief Complaint   Rash   History of Present Illness   Crystal Henry is a 29 y.o. year-old female with no pertinent medical history presenting to the ED with chief complaint of rash.  Red bumpy rash beneath the right breast for the past few days.  She is concerned because she has had close contact to someone who was diagnosed with MRSA.  She denies fever, no other complaints.  The rash is mildly uncomfortable and irritated with itchiness.  Review of Systems  A problem-focused ROS was performed. Positive for rash.  Patient denies fever.  Patient's Health History    Past Medical History:  Diagnosis Date  . Headache     Past Surgical History:  Procedure Laterality Date  . BREAST SURGERY    . BUNIONECTOMY     Rt foot  . CESAREAN SECTION    . ROTATOR CUFF REPAIR     R shoulder    History reviewed. No pertinent family history.  Social History   Socioeconomic History  . Marital status: Single    Spouse name: Not on file  . Number of children: Not on file  . Years of education: Not on file  . Highest education level: Not on file  Occupational History  . Not on file  Tobacco Use  . Smoking status: Current Every Day Smoker    Packs/day: 0.50    Types: Cigarettes  . Smokeless tobacco: Never Used  Vaping Use  . Vaping Use: Never used  Substance and Sexual Activity  . Alcohol use: Yes    Comment: occasionally  . Drug use: Not Currently    Types: Marijuana  . Sexual activity: Not on file  Other Topics Concern  . Not on file  Social History Narrative  . Not on file   Social Determinants of Health   Financial Resource Strain:   . Difficulty of Paying Living Expenses:   Food Insecurity:   . Worried About Programme researcher, broadcasting/film/video in the Last Year:   . Barista in the Last Year:   Transportation Needs:   . Freight forwarder  (Medical):   Marland Kitchen Lack of Transportation (Non-Medical):   Physical Activity:   . Days of Exercise per Week:   . Minutes of Exercise per Session:   Stress:   . Feeling of Stress :   Social Connections:   . Frequency of Communication with Friends and Family:   . Frequency of Social Gatherings with Friends and Family:   . Attends Religious Services:   . Active Member of Clubs or Organizations:   . Attends Banker Meetings:   Marland Kitchen Marital Status:   Intimate Partner Violence:   . Fear of Current or Ex-Partner:   . Emotionally Abused:   Marland Kitchen Physically Abused:   . Sexually Abused:      Physical Exam   Vitals:   04/04/20 0823  BP: 125/75  Pulse: 93  Resp: 16  Temp: 98.3 F (36.8 C)  SpO2: 98%    CONSTITUTIONAL: Well-appearing, NAD NEURO:  Alert and oriented x 3, no focal deficits EYES:  eyes equal and reactive ENT/NECK:  no LAD, no JVD CARDIO: Regular rate, well-perfused, normal S1 and S2 PULM:  CTAB no wheezing or rhonchi GI/GU:  normal bowel sounds, non-distended, non-tender MSK/SPINE:  No gross deformities, no edema SKIN: Macular erythematous rash beneath the  right breast at the skin fold with surrounding satellite lesions PSYCH:  Appropriate speech and behavior  *Additional and/or pertinent findings included in MDM below  Diagnostic and Interventional Summary    EKG Interpretation  Date/Time:    Ventricular Rate:    PR Interval:    QRS Duration:   QT Interval:    QTC Calculation:   R Axis:     Text Interpretation:        Labs Reviewed - No data to display  No orders to display    Medications - No data to display   Procedures  /  Critical Care Procedures  ED Course and Medical Decision Making  I have reviewed the triage vital signs, the nursing notes, and pertinent available records from the EMR.  Listed above are laboratory and imaging tests that I personally ordered, reviewed, and interpreted and then considered in my medical decision making  (see below for details).      Rash consistent with candidal rash, prescribed nystatin cream.  Normal vital signs, no fever, no emergent conditions.    Elmer Sow. Pilar Plate, MD [facility] Emergency Medicine [facility] [email or web address]  Final Clinical Impressions(s) / ED Diagnoses     ICD-10-CM   1. Rash  R21     ED Discharge Orders         Ordered    nystatin cream (MYCOSTATIN)     Discontinue  Reprint     04/04/20 0900           Discharge Instructions Discussed with and Provided to Patient:     Discharge Instructions     You were evaluated in the Emergency Department and after careful evaluation, we did not find any emergent condition requiring admission or further testing in the hospital.  Your exam/testing today was overall reassuring.  Your rash seems to be due to a candidal fungal rash.  Please use the cream as directed.  If after 2 weeks you do not see improvement, we would recommend follow-up with a dermatologist.  Please return to the Emergency Department if you experience any worsening of your condition.  Thank you for allowing [place] to be a part of your care.       Sabas Sous, MD 04/04/20 [phone]

## 2020-04-04 NOTE — ED Notes (Signed)
Chaperone for EDP Bero for exam of right breast.

## 2020-04-04 NOTE — Discharge Instructions (Signed)
You were evaluated in the Emergency Department and after careful evaluation, we did not find any emergent condition requiring admission or further testing in the hospital.  Your exam/testing today was overall reassuring.  Your rash seems to be due to a candidal fungal rash.  Please use the cream as directed.  If after 2 weeks you do not see improvement, we would recommend follow-up with a dermatologist.  Please return to the Emergency Department if you experience any worsening of your condition.  Thank you for allowing [place] to be a part of your care.

## 2020-07-29 ENCOUNTER — Emergency Department (HOSPITAL_BASED_OUTPATIENT_CLINIC_OR_DEPARTMENT_OTHER): Payer: Medicaid Other

## 2020-07-29 ENCOUNTER — Other Ambulatory Visit: Payer: Self-pay

## 2020-07-29 ENCOUNTER — Encounter (HOSPITAL_BASED_OUTPATIENT_CLINIC_OR_DEPARTMENT_OTHER): Payer: Self-pay | Admitting: Emergency Medicine

## 2020-07-29 ENCOUNTER — Emergency Department (HOSPITAL_BASED_OUTPATIENT_CLINIC_OR_DEPARTMENT_OTHER)
Admission: EM | Admit: 2020-07-29 | Discharge: 2020-07-30 | Disposition: A | Discharge status: Home / Self Care | Payer: Medicaid Other | Attending: Emergency Medicine | Admitting: Emergency Medicine

## 2020-07-29 DIAGNOSIS — S199XXA Unspecified injury of neck, initial encounter: Secondary | ICD-10-CM | POA: Diagnosis not present

## 2020-07-29 DIAGNOSIS — Y92410 Unspecified street and highway as the place of occurrence of the external cause: Secondary | ICD-10-CM | POA: Diagnosis not present

## 2020-07-29 DIAGNOSIS — F17210 Nicotine dependence, cigarettes, uncomplicated: Secondary | ICD-10-CM | POA: Diagnosis not present

## 2020-07-29 DIAGNOSIS — S299XXA Unspecified injury of thorax, initial encounter: Secondary | ICD-10-CM
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
## 2020-07-30 MED ORDER — CYCLOBENZAPRINE HCL 10 MG PO TABS: 10.0000 mg | ORAL_TABLET | Freq: Three times a day (TID) | ORAL | 0 refills | Status: DC | PRN

## 2020-07-30 MED ORDER — CYCLOBENZAPRINE HCL 10 MG PO TABS
10.0000 mg | ORAL_TABLET | Freq: Once | ORAL | Status: AC
  Administered 2020-07-30: 10 mg via ORAL
  Filled 2020-07-30: qty 1

## 2020-07-30 MED ORDER — NAPROXEN 250 MG PO TABS
500.0000 mg | ORAL_TABLET | Freq: Once | ORAL | Status: AC
  Administered 2020-07-30: 500 mg via ORAL
  Filled 2020-07-30: qty 2

## 2020-07-30 MED ORDER — NAPROXEN 375 MG PO TABS: ORAL_TABLET | ORAL | 0 refills | Status: AC

## 2020-07-30 MED ORDER — HYDROCODONE-ACETAMINOPHEN 5-325 MG PO TABS: 1.0000 | ORAL_TABLET | Freq: Four times a day (QID) | ORAL | 0 refills | Status: AC | PRN
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
## 2022-10-11 ENCOUNTER — Encounter (HOSPITAL_BASED_OUTPATIENT_CLINIC_OR_DEPARTMENT_OTHER): Payer: Self-pay

## 2022-10-11 ENCOUNTER — Emergency Department (HOSPITAL_BASED_OUTPATIENT_CLINIC_OR_DEPARTMENT_OTHER)
Admission: EM | Admit: 2022-10-11 | Discharge: 2022-10-11 | Disposition: A | Discharge status: Home / Self Care | Payer: No Typology Code available for payment source | Attending: Emergency Medicine | Admitting: Emergency Medicine

## 2022-10-11 ENCOUNTER — Emergency Department (HOSPITAL_BASED_OUTPATIENT_CLINIC_OR_DEPARTMENT_OTHER): Payer: No Typology Code available for payment source

## 2022-10-11 DIAGNOSIS — M25512 Pain in left shoulder: Secondary | ICD-10-CM | POA: Insufficient documentation

## 2022-10-11 DIAGNOSIS — M79642 Pain in left hand: Secondary | ICD-10-CM | POA: Insufficient documentation

## 2022-10-11 DIAGNOSIS — S63637A Sprain of interphalangeal joint of left little finger, initial encounter: Secondary | ICD-10-CM

## 2022-10-11 DIAGNOSIS — M79645 Pain in left finger(s): Secondary | ICD-10-CM | POA: Diagnosis not present

## 2022-10-11 DIAGNOSIS — M542 Cervicalgia: Secondary | ICD-10-CM | POA: Insufficient documentation

## 2022-10-11 DIAGNOSIS — M25511 Pain in right shoulder: Secondary | ICD-10-CM | POA: Diagnosis not present

## 2022-10-11 DIAGNOSIS — M546 Pain in thoracic spine: Secondary | ICD-10-CM | POA: Diagnosis not present

## 2022-10-11 DIAGNOSIS — Y92410 Unspecified street and highway as the place of occurrence of the external cause: Secondary | ICD-10-CM | POA: Insufficient documentation

## 2022-10-11 DIAGNOSIS — S161XXA Strain of muscle, fascia and tendon at neck level, initial encounter: Secondary | ICD-10-CM

## 2022-10-11 MED ORDER — IBUPROFEN 600 MG PO TABS: 600.0000 mg | ORAL_TABLET | Freq: Four times a day (QID) | ORAL | 0 refills | Status: AC | PRN

## 2022-10-11 MED ORDER — IBUPROFEN 400 MG PO TABS
600.0000 mg | ORAL_TABLET | Freq: Once | ORAL | Status: AC
  Administered 2022-10-11: 600 mg via ORAL
  Filled 2022-10-11: qty 1
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
## 2022-12-25 ENCOUNTER — Emergency Department (HOSPITAL_BASED_OUTPATIENT_CLINIC_OR_DEPARTMENT_OTHER)
Admission: EM | Admit: 2022-12-25 | Discharge: 2022-12-25 | Disposition: A | Discharge status: Home / Self Care | Payer: No Typology Code available for payment source | Attending: Emergency Medicine | Admitting: Emergency Medicine

## 2022-12-25 ENCOUNTER — Encounter (HOSPITAL_BASED_OUTPATIENT_CLINIC_OR_DEPARTMENT_OTHER): Payer: Self-pay | Admitting: Emergency Medicine

## 2022-12-25 ENCOUNTER — Emergency Department (HOSPITAL_BASED_OUTPATIENT_CLINIC_OR_DEPARTMENT_OTHER): Payer: No Typology Code available for payment source

## 2022-12-25 ENCOUNTER — Other Ambulatory Visit: Payer: Self-pay

## 2022-12-25 DIAGNOSIS — M542 Cervicalgia: Secondary | ICD-10-CM | POA: Diagnosis not present

## 2022-12-25 DIAGNOSIS — R0789 Other chest pain: Secondary | ICD-10-CM | POA: Diagnosis not present

## 2022-12-25 DIAGNOSIS — S0990XA Unspecified injury of head, initial encounter: Secondary | ICD-10-CM | POA: Diagnosis present

## 2022-12-25 DIAGNOSIS — S060X0A Concussion without loss of consciousness, initial encounter: Secondary | ICD-10-CM | POA: Diagnosis not present

## 2022-12-25 DIAGNOSIS — M25512 Pain in left shoulder: Secondary | ICD-10-CM | POA: Diagnosis not present

## 2022-12-25 DIAGNOSIS — Y92410 Unspecified street and highway as the place of occurrence of the external cause: Secondary | ICD-10-CM | POA: Insufficient documentation

## 2022-12-25 LAB — HCG, SERUM, QUALITATIVE: Preg, Serum: NEGATIVE

## 2022-12-25 MED ORDER — DIPHENHYDRAMINE HCL 50 MG/ML IJ SOLN
12.5000 mg | Freq: Once | INTRAMUSCULAR | Status: AC
  Administered 2022-12-25: 12.5 mg via INTRAVENOUS
  Filled 2022-12-25: qty 1

## 2022-12-25 MED ORDER — CYCLOBENZAPRINE HCL 10 MG PO TABS: 10.0000 mg | ORAL_TABLET | Freq: Two times a day (BID) | ORAL | 0 refills | Status: AC | PRN

## 2022-12-25 MED ORDER — KETOROLAC TROMETHAMINE 30 MG/ML IJ SOLN
30.0000 mg | Freq: Once | INTRAMUSCULAR | Status: AC
  Administered 2022-12-25: 30 mg via INTRAVENOUS
  Filled 2022-12-25: qty 1

## 2022-12-25 MED ORDER — PROCHLORPERAZINE EDISYLATE 10 MG/2ML IJ SOLN
10.0000 mg | Freq: Once | INTRAMUSCULAR | Status: AC
  Administered 2022-12-25: 10 mg via INTRAVENOUS
  Filled 2022-12-25: qty 2

## 2022-12-25 MED ORDER — DEXAMETHASONE SODIUM PHOSPHATE 10 MG/ML IJ SOLN
10.0000 mg | Freq: Once | INTRAMUSCULAR | Status: AC
  Administered 2022-12-25: 10 mg via INTRAVENOUS
  Filled 2022-12-25: qty 1

## 2022-12-25 MED ORDER — ACETAMINOPHEN 500 MG PO TABS
1000.0000 mg | ORAL_TABLET | Freq: Once | ORAL | Status: AC
  Administered 2022-12-25: 1000 mg via ORAL
  Filled 2022-12-25: qty 2

## 2022-12-25 MED ORDER — ONDANSETRON 4 MG PO TBDP: 4.0000 mg | ORAL_TABLET | Freq: Three times a day (TID) | ORAL | 0 refills | Status: AC | PRN

## 2022-12-25 MED ORDER — SODIUM CHLORIDE 0.9 % IV BOLUS
500.0000 mL | Freq: Once | INTRAVENOUS | Status: AC
  Administered 2022-12-25: 500 mL via INTRAVENOUS
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
## 2022-12-25 NOTE — ED Notes (Signed)
Pt refused C-collar.

## 2022-12-25 NOTE — Discharge Instructions (Addendum)
Take Zofran as needed for nausea.  Take Flexeril as needed for muscle spasms.  Do not mix with alcohol or drugs as this medication is sedating.  Recommend 1000 mg of Tylenol every 6 hours as needed for pain.  Recommend 800 mg ibuprofen every 8 hours as needed for pain.  Follow-up with Dr. Katrinka Blazing if having persistent headaches.
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
